# Patient Record
Sex: Female | Born: 1975 | Race: White | Hispanic: No | Marital: Married | Smoking: Former smoker
Health system: Southern US, Community
[De-identification: ages and names within clinical notes are randomized; demographics above are authoritative.]

## PROBLEM LIST (undated history)

## (undated) DIAGNOSIS — F419 Anxiety disorder, unspecified: Secondary | ICD-10-CM

## (undated) DIAGNOSIS — F329 Major depressive disorder, single episode, unspecified: Secondary | ICD-10-CM

## (undated) DIAGNOSIS — F32A Depression, unspecified: Secondary | ICD-10-CM

## (undated) HISTORY — DX: Depression, unspecified: F32.A

## (undated) HISTORY — PX: WRIST SURGERY: SHX841

## (undated) HISTORY — PX: PELVIC LAPAROSCOPY: SHX162

## (undated) HISTORY — DX: Anxiety disorder, unspecified: F41.9

## (undated) HISTORY — DX: Major depressive disorder, single episode, unspecified: F32.9

---

## 2001-07-07 ENCOUNTER — Other Ambulatory Visit: Admission: RE | Admit: 2001-07-07 | Discharge: 2001-07-07 | Payer: Self-pay | Admitting: Obstetrics and Gynecology

## 2004-09-11 ENCOUNTER — Encounter: Admission: RE | Admit: 2004-09-11 | Discharge: 2004-09-11 | Payer: Self-pay | Admitting: Orthopedic Surgery

## 2004-12-24 ENCOUNTER — Emergency Department (HOSPITAL_COMMUNITY): Admission: EM | Admit: 2004-12-24 | Discharge: 2004-12-24 | Payer: Self-pay | Admitting: *Deleted

## 2006-06-06 ENCOUNTER — Ambulatory Visit (HOSPITAL_COMMUNITY): Payer: Self-pay | Admitting: Psychiatry

## 2006-06-26 ENCOUNTER — Ambulatory Visit (HOSPITAL_COMMUNITY): Payer: Self-pay | Admitting: Psychiatry

## 2006-07-31 ENCOUNTER — Ambulatory Visit (HOSPITAL_COMMUNITY): Payer: Self-pay | Admitting: Psychiatry

## 2006-09-03 ENCOUNTER — Ambulatory Visit (HOSPITAL_COMMUNITY): Payer: Self-pay | Admitting: Psychiatry

## 2006-10-02 ENCOUNTER — Ambulatory Visit (HOSPITAL_COMMUNITY): Payer: Self-pay | Admitting: Psychiatry

## 2006-11-21 ENCOUNTER — Ambulatory Visit (HOSPITAL_COMMUNITY): Payer: Self-pay | Admitting: Psychiatry

## 2006-12-19 ENCOUNTER — Ambulatory Visit (HOSPITAL_COMMUNITY): Payer: Self-pay | Admitting: Psychiatry

## 2007-01-27 ENCOUNTER — Ambulatory Visit (HOSPITAL_COMMUNITY): Payer: Self-pay | Admitting: Psychiatry

## 2007-03-04 ENCOUNTER — Ambulatory Visit (HOSPITAL_COMMUNITY): Payer: Self-pay | Admitting: Psychiatry

## 2007-03-21 ENCOUNTER — Ambulatory Visit (HOSPITAL_COMMUNITY): Payer: Self-pay | Admitting: Psychiatry

## 2007-06-05 ENCOUNTER — Ambulatory Visit (HOSPITAL_COMMUNITY): Payer: Self-pay | Admitting: Psychiatry

## 2007-09-10 ENCOUNTER — Ambulatory Visit (HOSPITAL_COMMUNITY): Payer: Self-pay | Admitting: Psychiatry

## 2007-10-21 ENCOUNTER — Ambulatory Visit: Admission: RE | Admit: 2007-10-21 | Discharge: 2007-10-21 | Payer: Self-pay | Admitting: Gynecology

## 2008-01-20 ENCOUNTER — Ambulatory Visit (HOSPITAL_COMMUNITY): Payer: Self-pay | Admitting: Psychiatry

## 2008-07-14 ENCOUNTER — Ambulatory Visit (HOSPITAL_COMMUNITY): Payer: Self-pay | Admitting: Psychiatry

## 2009-02-20 ENCOUNTER — Inpatient Hospital Stay (HOSPITAL_COMMUNITY): Admission: AD | Admit: 2009-02-20 | Discharge: 2009-02-20 | Payer: Self-pay | Admitting: Obstetrics & Gynecology

## 2009-02-20 ENCOUNTER — Ambulatory Visit: Payer: Self-pay | Admitting: Advanced Practice Midwife

## 2009-03-30 ENCOUNTER — Encounter: Admission: RE | Admit: 2009-03-30 | Discharge: 2009-05-11 | Payer: Self-pay | Admitting: Obstetrics & Gynecology

## 2009-06-05 ENCOUNTER — Inpatient Hospital Stay (HOSPITAL_COMMUNITY): Admission: AD | Admit: 2009-06-05 | Discharge: 2009-06-08 | Payer: Self-pay | Admitting: Obstetrics and Gynecology

## 2009-06-09 ENCOUNTER — Encounter: Admission: RE | Admit: 2009-06-09 | Discharge: 2009-07-09 | Payer: Self-pay | Admitting: Obstetrics and Gynecology

## 2009-07-10 ENCOUNTER — Encounter: Admission: RE | Admit: 2009-07-10 | Discharge: 2009-08-09 | Payer: Self-pay | Admitting: Obstetrics & Gynecology

## 2009-09-08 ENCOUNTER — Encounter: Admission: RE | Admit: 2009-09-08 | Discharge: 2009-10-08 | Payer: Self-pay | Admitting: Obstetrics & Gynecology

## 2009-10-09 ENCOUNTER — Encounter: Admission: RE | Admit: 2009-10-09 | Discharge: 2009-11-08 | Payer: Self-pay | Admitting: Obstetrics & Gynecology

## 2009-11-09 ENCOUNTER — Encounter: Admission: RE | Admit: 2009-11-09 | Discharge: 2009-12-09 | Payer: Self-pay | Admitting: Obstetrics & Gynecology

## 2009-12-09 ENCOUNTER — Ambulatory Visit (HOSPITAL_COMMUNITY): Payer: Self-pay | Admitting: Psychiatry

## 2009-12-10 ENCOUNTER — Encounter: Admission: RE | Admit: 2009-12-10 | Discharge: 2010-01-09 | Payer: Self-pay | Admitting: Obstetrics & Gynecology

## 2010-01-10 ENCOUNTER — Encounter: Admission: RE | Admit: 2010-01-10 | Discharge: 2010-02-07 | Payer: Self-pay | Admitting: Obstetrics & Gynecology

## 2010-02-10 ENCOUNTER — Encounter: Admission: RE | Admit: 2010-02-10 | Discharge: 2010-03-12 | Payer: Self-pay | Admitting: Obstetrics & Gynecology

## 2010-03-13 ENCOUNTER — Encounter
Admission: RE | Admit: 2010-03-13 | Discharge: 2010-04-12 | Payer: Self-pay | Source: Home / Self Care | Admitting: Obstetrics & Gynecology

## 2010-04-13 ENCOUNTER — Encounter
Admission: RE | Admit: 2010-04-13 | Discharge: 2010-05-13 | Payer: Self-pay | Source: Home / Self Care | Attending: Obstetrics & Gynecology | Admitting: Obstetrics & Gynecology

## 2010-05-14 ENCOUNTER — Encounter
Admission: RE | Admit: 2010-05-14 | Discharge: 2010-06-13 | Payer: Self-pay | Source: Home / Self Care | Attending: Obstetrics & Gynecology | Admitting: Obstetrics & Gynecology

## 2010-06-14 ENCOUNTER — Encounter (HOSPITAL_COMMUNITY)
Admission: RE | Admit: 2010-06-14 | Discharge: 2010-06-14 | Disposition: A | Discharge status: Home / Self Care | Payer: 59 | Source: Ambulatory Visit | Attending: Obstetrics & Gynecology | Admitting: Obstetrics & Gynecology

## 2010-06-14 DIAGNOSIS — O923 Agalactia: Secondary | ICD-10-CM | POA: Insufficient documentation

## 2010-06-23 ENCOUNTER — Encounter (HOSPITAL_COMMUNITY): Payer: Self-pay | Admitting: Physician Assistant

## 2010-07-07 ENCOUNTER — Encounter (HOSPITAL_COMMUNITY): Payer: 59 | Admitting: Physician Assistant

## 2010-07-07 DIAGNOSIS — F429 Obsessive-compulsive disorder, unspecified: Secondary | ICD-10-CM

## 2010-07-30 LAB — CBC
HCT: 41.4 % (ref 36.0–46.0)
Hemoglobin: 14.2 g/dL (ref 12.0–15.0)
MCHC: 34.2 g/dL (ref 30.0–36.0)
MCV: 92.5 fL (ref 78.0–100.0)
MCV: 93.8 fL (ref 78.0–100.0)
Platelets: 181 10*3/uL (ref 150–400)
Platelets: 222 10*3/uL (ref 150–400)
RBC: 3.56 MIL/uL — ABNORMAL LOW (ref 3.87–5.11)
RBC: 4.48 MIL/uL (ref 3.87–5.11)
RDW: 13.7 % (ref 11.5–15.5)
RDW: 13.9 % (ref 11.5–15.5)
WBC: 14.5 10*3/uL — ABNORMAL HIGH (ref 4.0–10.5)

## 2010-08-29 ENCOUNTER — Encounter (HOSPITAL_COMMUNITY): Payer: 59 | Admitting: Physician Assistant

## 2010-09-26 NOTE — Consult Note (Signed)
NAME:  Miranda Black, Miranda Black NO.:  [id number]   MEDICAL RECORD NO.:  [id number]          PATIENT TYPE:  OUT   LOCATION:  GYN                          FACILITY:  [facility]   PHYSICIAN:  De Blanch, M.D.DATE OF BIRTH:  [birth year]   DATE OF CONSULTATION:  10/21/2007  DATE OF DISCHARGE:                                 CONSULTATION   CHIEF COMPLAINT:  Pelvic mass.   HISTORY OF PRESENT ILLNESS:  A 35 year old white female seen in  consultation at the request of Dr. Marina Gravel regarding management of  a newly diagnosed adnexal mass.  The mass was detected on routine annual  pelvic examination.  The patient denies any new pelvic pain, although  she does note that she has dysmenorrhea, despite the fact being on  chronic birth control pills.  The mass is evaluated by ultrasound and  measures 7.1 x 7.0 x 6.6 cm, and there is a second mass in the right  pelvis measuring 5.8 x 4.6 x 4.7 cm.  This is thought to be possibly a  dermoid or an endometrioma.  There is no increased blood flow.  The left  ovary appears normal with a follicle measuring 2.7 cm.  The patient had  tumor markers obtained, and the CA-125 was 43.5 U/ml, CEA 5, and LDH  129.  Beta HCG is less than 1.   PAST OBSTETRICAL HISTORY:  Gravida 0.   GYNECOLOGIC HISTORY:  Patient uses oral contraceptives.  Has no past  history of abnormal Pap smears.   PAST MEDICAL HISTORY:  Obsessive-compulsive disease.   DRUG ALLERGIES:  None.   CURRENT MEDICATIONS:  1. Zoloft 50 mg a day.  2. Adderall 20 mg q.a.m.   SOCIAL HISTORY:  Patient is married.  She smokes 1-4 cigarettes a day.   FAMILY HISTORY:  Negative for gynecologic, breast, or colon cancer.  She  has a maternal grandmother with lung cancer.   REVIEW OF SYSTEMS:  A 10-point comprehensive review of systems is  negative, except as noted above.   PHYSICAL EXAMINATION:  Weight 127 pounds.  Blood pressure 110/70, pulse  70, respiratory rate 20.  GENERAL:   Patient is a healthy young woman in no acute distress.  She  comes accompanied by her mother.  HEENT:  Negative.  NECK:  Supple without thyromegaly.  There is no supraclavicular or  inguinal adenopathy.  ABDOMEN:  Soft and nontender.  No masses, organomegaly, ascites, or  hernias are noted.  PELVIC:  EG/BUS, vagina, bladder, and urethra are normal.  The cervix  and uterus appear normal.  On bimanual examination, there is a 7 cm  smooth adnexal mass, which is slightly tender in the right adnexal  region.  Rectovaginal exam confirms.  EXTREMITIES:  Lower extremities are without edema and varicosities.   IMPRESSION:  Complex pelvic mass with an elevated CEA and CA-125.  This  may well be a benign endometrioma or mucinous cyst adenoma, although  malignancy must still be considered.  I would recommend the patient  undergo exploratory laparotomy, ovarian cystectomy with attempts at  conserving normal ovarian tissue until we  have a firm diagnosis.  The  patient understands that she may lose her ovary on that side and further  understands that his may be ovarian cancer, although it would be unusual  in a young woman in this age group.   Patient wishes to go ahead with surgery in conjunction with her upcoming  vacation.  The only opportunity we have to perform surgery within a few  days of her vacation would be to perform the surgery on November 18, 2007 at  [facility].  She would like to go ahead, and we will therefore schedule the  surgery to be performed at [facility].  We will plan on performing the surgery  through a Pfannenstiel incision, although she understands that a midline  incision may be required in order to gain adequate exposure or to  conduct appropriate surgical staging.  The risks of surgery, including  hemorrhage, infection, injury to adjacent viscera, thromboembolic  complications, and anesthetic risks were discussed.  All questions are  answered that the patient and her mother  have.      De Blanch, M.D.  Electronically Signed     DC/MEDQ  D:  10/21/2007  T:  10/21/2007  Job:  [id number]   cc:   Gerri Spore B. Earlene Plater, M.D.  Fax: [phone]   Telford Nab, R.N.  [address]  [place], [place] [address]

## 2017-08-08 ENCOUNTER — Other Ambulatory Visit: Payer: Self-pay

## 2017-12-06 ENCOUNTER — Encounter: Payer: Self-pay | Admitting: Obstetrics & Gynecology

## 2018-03-18 ENCOUNTER — Ambulatory Visit (INDEPENDENT_AMBULATORY_CARE_PROVIDER_SITE_OTHER): Payer: Managed Care, Other (non HMO) | Admitting: Obstetrics & Gynecology

## 2018-03-18 ENCOUNTER — Encounter: Payer: Self-pay | Admitting: Obstetrics & Gynecology

## 2018-03-18 VITALS — BP 116/72 | Ht 63.0 in | Wt 148.0 lb

## 2018-03-18 DIAGNOSIS — Z01419 Encounter for gynecological examination (general) (routine) without abnormal findings: Secondary | ICD-10-CM

## 2018-03-18 DIAGNOSIS — Z1151 Encounter for screening for human papillomavirus (HPV): Secondary | ICD-10-CM | POA: Diagnosis not present

## 2018-03-18 DIAGNOSIS — Z30011 Encounter for initial prescription of contraceptive pills: Secondary | ICD-10-CM

## 2018-03-18 MED ORDER — NORETHIN ACE-ETH ESTRAD-FE 1-20 MG-MCG PO TABS: 1.0000 | ORAL_TABLET | Freq: Every day | ORAL | 4 refills | Status: DC

## 2018-03-18 NOTE — Progress Notes (Signed)
Miranda Black 24-Jun-1975 [id number]   History:    42 y.o. G1P1L1 Married.  Daughter is 40 yo in 3rd grade.  RP:  Established patient presenting for annual gyn exam   HPI: Normal menstrual periods every month.  No breakthrough bleeding.  Increased PMS over the last year.  Currently using condoms, but would like to start on birth control pills.  No pelvic pain.  No pain with intercourse.  Urine and bowel movements normal.  Breasts normal.  Will schedule first screening mammogram now.  Body mass index 26.22.  Fit and eating well.  Past medical history,surgical history, family history and social history were all reviewed and documented in the EPIC chart.  Gynecologic History Patient's last menstrual period was 03/09/2018. Contraception: condoms Last Pap: 2017. Results were: normal Last mammogram: [facility] Bone Density: Never Colonoscopy: Never  Obstetric History OB History  Gravida Para Term Preterm AB Living  1 1       1   SAB TAB Ectopic Multiple Live Births               # Outcome Date GA Lbr Len/2nd Weight Sex Delivery Anes PTL Lv  1 Para              ROS: A ROS was performed and pertinent positives and negatives are included in the history.  GENERAL: No fevers or chills. HEENT: No change in vision, no earache, sore throat or sinus congestion. NECK: No pain or stiffness. CARDIOVASCULAR: No chest pain or pressure. No palpitations. PULMONARY: No shortness of breath, cough or wheeze. GASTROINTESTINAL: No abdominal pain, nausea, vomiting or diarrhea, melena or bright red blood per rectum. GENITOURINARY: No urinary frequency, urgency, hesitancy or dysuria. MUSCULOSKELETAL: No joint or muscle pain, no back pain, no recent trauma. DERMATOLOGIC: No rash, no itching, no lesions. ENDOCRINE: No polyuria, polydipsia, no heat or cold intolerance. No recent change in weight. HEMATOLOGICAL: No anemia or easy bruising or bleeding. NEUROLOGIC: No headache, seizures,  numbness, tingling or weakness. PSYCHIATRIC: No depression, no loss of interest in normal activity or change in sleep pattern.     Exam:   BP 116/72   Ht 5\' 3"  (1.6 m)   Wt 148 lb (67.1 kg)   LMP 03/09/2018   BMI 26.22 kg/m   Body mass index is 26.22 kg/m.  General appearance : Well developed well nourished female. No acute distress HEENT: Eyes: no retinal hemorrhage or exudates,  Neck supple, trachea midline, no carotid bruits, no thyroidmegaly Lungs: Clear to auscultation, no rhonchi or wheezes, or rib retractions  Heart: Regular rate and rhythm, no murmurs or gallops Breast:Examined in sitting and supine position were symmetrical in appearance, no palpable masses or tenderness,  no skin retraction, no nipple inversion, no nipple discharge, no skin discoloration, no axillary or supraclavicular lymphadenopathy Abdomen: no palpable masses or tenderness, no rebound or guarding Extremities: no edema or skin discoloration or tenderness  Pelvic: Vulva: Normal             Vagina: No gross lesions or discharge  Cervix: No gross lesions or discharge.  Pap/HPV HR done  Uterus  AV, normal size, shape and consistency, non-tender and mobile  Adnexa  Without masses or tenderness  Anus: Normal   Assessment/Plan:  42 y.o. female for annual exam   1. Encounter for routine gynecological examination with Papanicolaou smear of cervix Normal gynecologic exam.  Pap with high-risk HPV done today.  Breast exam normal.  Schedule  first screening mammogram at the breast center now.  Body mass index 26.22.  Continue to exercise regularly with aerobic activities 5 times a week and weightlifting every 2 days.  Healthy nutrition with plenty of vegetables.  2. Encounter for initial prescription of contraceptive pills Contraceptive options discussed with patient.  Prefers birth control pills.  Decision to start on Junel FE 1/20.  Usage of birth control pill as well as continuous use discussed with patient.   Risks and benefits reviewed.  Prescription sent to pharmacy.  Order Junel FE 1/20 prescription of 3 packs, refills x4  Genia Del MD, 2:39 PM 03/18/2018

## 2018-03-19 ENCOUNTER — Encounter: Payer: Self-pay | Admitting: Obstetrics & Gynecology

## 2018-03-19 NOTE — Patient Instructions (Signed)
1. Encounter for routine gynecological examination with Papanicolaou smear of cervix Normal gynecologic exam.  Pap with high-risk HPV done today.  Breast exam normal.  Schedule first screening mammogram at the breast center now.  Body mass index 26.22.  Continue to exercise regularly with aerobic activities 5 times a week and weightlifting every 2 days.  Healthy nutrition with plenty of vegetables.  2. Encounter for initial prescription of contraceptive pills Contraceptive options discussed with patient.  Prefers birth control pills.  Decision to start on Junel FE 1/20.  Usage of birth control pill as well as continuous use discussed with patient.  Risks and benefits reviewed.  Prescription sent to pharmacy.  Order Junel FE 1/20 prescription of 3 packs, refills x4  Miranda Black, it was a pleasure seeing you today!  I will inform you of your results as soon as they are available.

## 2018-03-20 LAB — PAP, TP IMAGING W/ HPV RNA, RFLX HPV TYPE 16,18/45: HPV DNA High Risk: NOT DETECTED

## 2018-07-16 DIAGNOSIS — F988 Other specified behavioral and emotional disorders with onset usually occurring in childhood and adolescence: Secondary | ICD-10-CM | POA: Diagnosis not present

## 2018-07-16 DIAGNOSIS — F429 Obsessive-compulsive disorder, unspecified: Secondary | ICD-10-CM | POA: Diagnosis not present

## 2018-07-16 DIAGNOSIS — J069 Acute upper respiratory infection, unspecified: Secondary | ICD-10-CM | POA: Diagnosis not present

## 2018-09-08 DIAGNOSIS — F429 Obsessive-compulsive disorder, unspecified: Secondary | ICD-10-CM | POA: Diagnosis not present

## 2018-09-08 DIAGNOSIS — F988 Other specified behavioral and emotional disorders with onset usually occurring in childhood and adolescence: Secondary | ICD-10-CM | POA: Diagnosis not present

## 2018-10-28 DIAGNOSIS — Z20828 Contact with and (suspected) exposure to other viral communicable diseases: Secondary | ICD-10-CM | POA: Diagnosis not present

## 2019-02-10 DIAGNOSIS — Z23 Encounter for immunization: Secondary | ICD-10-CM | POA: Diagnosis not present

## 2019-03-05 DIAGNOSIS — F988 Other specified behavioral and emotional disorders with onset usually occurring in childhood and adolescence: Secondary | ICD-10-CM | POA: Diagnosis not present

## 2019-03-05 DIAGNOSIS — F429 Obsessive-compulsive disorder, unspecified: Secondary | ICD-10-CM | POA: Diagnosis not present

## 2019-03-19 ENCOUNTER — Other Ambulatory Visit: Payer: Self-pay | Admitting: Obstetrics & Gynecology

## 2019-03-19 ENCOUNTER — Other Ambulatory Visit: Payer: Self-pay | Admitting: Family Medicine

## 2019-03-19 DIAGNOSIS — Z1231 Encounter for screening mammogram for malignant neoplasm of breast: Secondary | ICD-10-CM

## 2019-03-24 ENCOUNTER — Ambulatory Visit
Admission: RE | Admit: 2019-03-24 | Discharge: 2019-03-24 | Disposition: A | Discharge status: Home / Self Care | Payer: BC Managed Care – PPO | Source: Ambulatory Visit | Attending: Family Medicine | Admitting: Family Medicine

## 2019-03-24 ENCOUNTER — Other Ambulatory Visit: Payer: Self-pay

## 2019-03-24 DIAGNOSIS — Z1231 Encounter for screening mammogram for malignant neoplasm of breast: Secondary | ICD-10-CM | POA: Diagnosis not present

## 2019-06-22 ENCOUNTER — Ambulatory Visit: Payer: Managed Care, Other (non HMO) | Attending: Internal Medicine

## 2019-06-22 DIAGNOSIS — Z23 Encounter for immunization: Secondary | ICD-10-CM

## 2019-06-22 NOTE — Progress Notes (Signed)
   Covid-19 Vaccination Clinic  Name:  Miranda Black    MRN: [id number] DOB: 29-May-1975  06/22/2019  Miranda Black was observed post Covid-19 immunization for 15 minutes without incidence. She was provided with Vaccine Information Sheet and instruction to access the V-Safe system.   Miranda Black was instructed to call 911 with any severe reactions post vaccine: Marland Kitchen Difficulty breathing  . Swelling of your face and throat  . A fast heartbeat  . A bad rash all over your body  . Dizziness and weakness    Immunizations Administered    Name Date Dose VIS Date Route   Pfizer COVID-19 Vaccine 06/22/2019  5:52 PM 0.3 mL 04/24/2019 Intramuscular   Manufacturer: [employer], [employer]   Lot: [id number]   NDC: [address]-8

## 2019-07-17 ENCOUNTER — Ambulatory Visit: Payer: Self-pay | Attending: Internal Medicine

## 2019-07-17 DIAGNOSIS — Z23 Encounter for immunization: Secondary | ICD-10-CM | POA: Insufficient documentation

## 2019-07-17 NOTE — Progress Notes (Signed)
   Covid-19 Vaccination Clinic  Name:  Miranda Black    MRN: [id number] DOB: 1975-12-08  07/17/2019  Miranda Black was observed post Covid-19 immunization for 15 minutes without incident. She was provided with Vaccine Information Sheet and instruction to access the V-Safe system.   Miranda Black was instructed to call 911 with any severe reactions post vaccine: Marland Kitchen Difficulty breathing  . Swelling of face and throat  . A fast heartbeat  . A bad rash all over body  . Dizziness and weakness   Immunizations Administered    Name Date Dose VIS Date Route   Pfizer COVID-19 Vaccine 07/17/2019  5:05 PM 0.3 mL 04/24/2019 Intramuscular   Manufacturer: [employer], [employer]   Lot: [id number]   NDC: [address]-0

## 2019-07-30 ENCOUNTER — Encounter: Payer: Self-pay | Admitting: Obstetrics & Gynecology

## 2019-07-30 ENCOUNTER — Other Ambulatory Visit: Payer: Self-pay

## 2019-07-30 ENCOUNTER — Ambulatory Visit: Payer: BC Managed Care – PPO | Admitting: Obstetrics & Gynecology

## 2019-07-30 VITALS — BP 118/70

## 2019-07-30 DIAGNOSIS — Z30011 Encounter for initial prescription of contraceptive pills: Secondary | ICD-10-CM | POA: Diagnosis not present

## 2019-07-30 DIAGNOSIS — R350 Frequency of micturition: Secondary | ICD-10-CM | POA: Diagnosis not present

## 2019-07-30 DIAGNOSIS — R102 Pelvic and perineal pain: Secondary | ICD-10-CM | POA: Diagnosis not present

## 2019-07-30 MED ORDER — SULFAMETHOXAZOLE-TRIMETHOPRIM 800-160 MG PO TABS: 1.0000 | ORAL_TABLET | Freq: Two times a day (BID) | ORAL | 0 refills | Status: AC

## 2019-07-30 MED ORDER — NORETHIN ACE-ETH ESTRAD-FE 1-20 MG-MCG PO TABS: 1.0000 | ORAL_TABLET | Freq: Every day | ORAL | 4 refills | Status: DC

## 2019-07-30 NOTE — Progress Notes (Signed)
    [facility] 02/08/1976 [id number]        44 y.o.  G1P1L1 Married.  Daughter 9+ yo   RP: Urinary frequency with pelvic pain x 1 week  HPI: Normal menses every month.  Not using contraception.  Normal LMP 07/09/2019.  C/O urinary frequency with pelivc pain Rt>Lt x 1 week.  No blood in urine.  No fever.  BMs normal.   OB History  Gravida Para Term Preterm AB Living  1 1       1   SAB TAB Ectopic Multiple Live Births               # Outcome Date GA Lbr Len/2nd Weight Sex Delivery Anes PTL Lv  1 Para             Past medical history,surgical history, problem list, medications, allergies, family history and social history were all reviewed and documented in the EPIC chart.   Directed ROS with pertinent positives and negatives documented in the history of present illness/assessment and plan.  Exam:  Vitals:   07/30/19 1520  BP: 118/70   General appearance:  Normal  CVAT Negative Bilaterally  Abdomen: Normal  Gynecologic exam: Vulvar.  Bimanual exam:  Uterus AV, normal volume, mobile, NT.  No adnexal mass/NT bilaterally.  Normal vaginal secretions.  U/A: Yellow cloudy, nitrites negative, proteins negative, white blood cells 10-20, red blood cells 0-2, moderate bacteria.  Pending urine culture.   Assessment/Plan:  44 y.o. G1P1   1. Frequency of urination Probable acute cystitis per symptoms and abnormal urine analysis.  Findings reviewed with patient.  No allergies to antibiotics.  Decision to treat with Bactrim DS 1 tablet twice a day for 3 days.  Usage reviewed and prescription sent to pharmacy.  Pending urine culture. - Urinalysis,Complete w/RFL Culture  2. Acute pelvic pain, female Pelvic exam normal today but decision to investigate further with a pelvic ultrasound at follow-up. - 46 Transvaginal Non-OB; Future  3. Encounter for initial prescription of contraceptive pills Will start on a low-dose birth control pill for contraception.  No contraindication to  birth control pills.  Usage, risks and benefits reviewed.  The generic of Loestrin Fe 1/20 prescribed.  Other orders - sulfamethoxazole-trimethoprim (BACTRIM DS) 800-160 MG tablet; Take 1 tablet by mouth 2 (two) times daily for 3 days. - norethindrone-ethinyl estradiol (LOESTRIN FE) 1-20 MG-MCG tablet; Take 1 tablet by mouth daily.   2/20 MD, 3:43 PM 07/30/2019

## 2019-08-01 LAB — URINALYSIS, COMPLETE W/RFL CULTURE
Bilirubin Urine: NEGATIVE
Glucose, UA: NEGATIVE
Hyaline Cast: NONE SEEN /LPF
Ketones, ur: NEGATIVE
Nitrites, Initial: NEGATIVE
Protein, ur: NEGATIVE
Specific Gravity, Urine: 1.025 (ref 1.001–1.03)
pH: 7 (ref 5.0–8.0)

## 2019-08-01 LAB — CULTURE INDICATED

## 2019-08-01 LAB — URINE CULTURE
MICRO NUMBER:: 10266236
Result:: NO GROWTH
SPECIMEN QUALITY:: ADEQUATE

## 2019-08-06 ENCOUNTER — Encounter: Payer: Self-pay | Admitting: Obstetrics & Gynecology

## 2019-08-06 NOTE — Patient Instructions (Signed)
1. Frequency of urination Probable acute cystitis per symptoms and abnormal urine analysis.  Findings reviewed with patient.  No allergies to antibiotics.  Decision to treat with Bactrim DS 1 tablet twice a day for 3 days.  Usage reviewed and prescription sent to pharmacy.  Pending urine culture. - Urinalysis,Complete w/RFL Culture  2. Acute pelvic pain, female Pelvic exam normal today but decision to investigate further with a pelvic ultrasound at follow-up. - US Transvaginal Non-OB; Future  3. Encounter for initial prescription of contraceptive pills Will start on a low-dose birth control pill for contraception.  No contraindication to birth control pills.  Usage, risks and benefits reviewed.  The generic of Loestrin Fe 1/20 prescribed.  Other orders - sulfamethoxazole-trimethoprim (BACTRIM DS) 800-160 MG tablet; Take 1 tablet by mouth 2 (two) times daily for 3 days. - norethindrone-ethinyl estradiol (LOESTRIN FE) 1-20 MG-MCG tablet; Take 1 tablet by mouth daily.   Miranda Black, it was a pleasure seeing you today!  I will inform you of your results as soon as they are available.

## 2019-08-12 ENCOUNTER — Other Ambulatory Visit: Payer: Self-pay

## 2019-08-13 ENCOUNTER — Encounter: Payer: Self-pay | Admitting: Obstetrics & Gynecology

## 2019-08-13 ENCOUNTER — Ambulatory Visit (INDEPENDENT_AMBULATORY_CARE_PROVIDER_SITE_OTHER): Payer: BC Managed Care – PPO | Admitting: Obstetrics & Gynecology

## 2019-08-13 ENCOUNTER — Ambulatory Visit (INDEPENDENT_AMBULATORY_CARE_PROVIDER_SITE_OTHER): Payer: BC Managed Care – PPO

## 2019-08-13 VITALS — BP 122/76

## 2019-08-13 DIAGNOSIS — R102 Pelvic and perineal pain: Secondary | ICD-10-CM

## 2019-08-13 DIAGNOSIS — N800 Endometriosis of uterus: Secondary | ICD-10-CM | POA: Diagnosis not present

## 2019-08-13 DIAGNOSIS — N8003 Adenomyosis of the uterus: Secondary | ICD-10-CM

## 2019-08-13 NOTE — Progress Notes (Signed)
    Miranda Black [facility] [birth year] [id number]        44 y.o.  G1P1L1  RP: Pelvic Pain for Pelvic US  HPI: Decreased pelvic pain, feeling better.  Finished the ABTx, but Urine Culture was negative.  Will start BCPs this Sunday.   OB History  Gravida Para Term Preterm AB Living  1 1       1   SAB TAB Ectopic Multiple Live Births               # Outcome Date GA Lbr Len/2nd Weight Sex Delivery Anes PTL Lv  1 Para             Past medical history,surgical history, problem list, medications, allergies, family history and social history were all reviewed and documented in the EPIC chart.   Directed ROS with pertinent positives and negatives documented in the history of present illness/assessment and plan.  Exam:  Vitals:   08/13/19 0932  BP: 122/76   General appearance:  Normal  Pelvic 10/13/19 today: T/V images.  Anteverted uterus normal in size and shape with no myometrial mass.  Several small cystic areas in the myometrium are suggestive of adenomyosis.  The uterus is measured at 9.31 x 5.75 x 3.89 cm.  The endometrial lining is thin and symmetrical measured at 3.28 mm.  No mass or thickening seen in the endometrium.  Both ovaries are mobile and nontender with normal perfusion.  The right ovary shows a normal follicular pattern.  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  No free fluid in the posterior cul-de-sac.  Nontender vaginal exam.  No mass or free fluid seen in the area of the appendix.   Assessment/Plan:  44 y.o. G1P1   1. Acute pelvic pain, female Urine Culture Negative.  No pelvic pain today.  Pelvic 46 findings reviewed.  The uterus is normal in size with probable adenomyosis.  The endometrial lining is thin and symmetrical at 3.28 millimeter with no mass or thickening.  The right ovary is normal in size and appearance.  The left ovary is normal in size with a small nodule  measured at 1.6 cm compatible with a small dermoid or fibroma.  Patient is reassured about the findings.  Will observe on birth control pills.  2. Adenomyosis Diagnosis discussed with patient, reassured.  Management reviewed.  Starting BCPs.  Continuous BCP use instructed.  [place] MD, 9:51 AM 08/13/2019

## 2019-08-13 NOTE — Patient Instructions (Signed)
1. Acute pelvic pain, female Urine Culture Negative.  No pelvic pain today.  Pelvic US findings reviewed.  The uterus is normal in size with probable adenomyosis.  The endometrial lining is thin and symmetrical at 3.28 millimeter with no mass or thickening.  The right ovary is normal in size and appearance.  The left ovary is normal in size with a small nodule measured at 1.6 cm compatible with a small dermoid or fibroma.  Patient is reassured about the findings.  Will observe on birth control pills.  2. Adenomyosis Diagnosis discussed with patient, reassured.  Management reviewed.  Starting BCPs.  Continuous BCP use instructed.  Miranda Black, it was a pleasure seeing you today!

## 2019-08-31 DIAGNOSIS — F429 Obsessive-compulsive disorder, unspecified: Secondary | ICD-10-CM | POA: Diagnosis not present

## 2019-08-31 DIAGNOSIS — F988 Other specified behavioral and emotional disorders with onset usually occurring in childhood and adolescence: Secondary | ICD-10-CM | POA: Diagnosis not present

## 2019-09-15 ENCOUNTER — Ambulatory Visit (INDEPENDENT_AMBULATORY_CARE_PROVIDER_SITE_OTHER): Payer: BC Managed Care – PPO | Admitting: Obstetrics & Gynecology

## 2019-09-15 ENCOUNTER — Other Ambulatory Visit: Payer: Self-pay

## 2019-09-15 ENCOUNTER — Encounter: Payer: Self-pay | Admitting: Obstetrics & Gynecology

## 2019-09-15 VITALS — BP 120/70 | Ht 62.5 in | Wt 148.8 lb

## 2019-09-15 DIAGNOSIS — R87610 Atypical squamous cells of undetermined significance on cytologic smear of cervix (ASC-US): Secondary | ICD-10-CM | POA: Diagnosis not present

## 2019-09-15 DIAGNOSIS — R350 Frequency of micturition: Secondary | ICD-10-CM | POA: Diagnosis not present

## 2019-09-15 DIAGNOSIS — Z01419 Encounter for gynecological examination (general) (routine) without abnormal findings: Secondary | ICD-10-CM | POA: Diagnosis not present

## 2019-09-15 DIAGNOSIS — Z1151 Encounter for screening for human papillomavirus (HPV): Secondary | ICD-10-CM

## 2019-09-15 DIAGNOSIS — Z3041 Encounter for surveillance of contraceptive pills: Secondary | ICD-10-CM | POA: Diagnosis not present

## 2019-09-15 MED ORDER — CIPROFLOXACIN HCL 500 MG PO TABS: 500.0000 mg | ORAL_TABLET | Freq: Two times a day (BID) | ORAL | 0 refills | Status: AC

## 2019-09-15 NOTE — Patient Instructions (Signed)
1. Encounter for routine gynecological examination with Papanicolaou smear of cervix Normal gynecologic exam.  Pap/HPV HR done.  Breast exam normal.  Screening mammo 03/2019 Negative.  Fasting health labs with Fam MD.  2. ASCUS of cervix with negative high risk HPV Pap 03/2018 ASCUS, HPV HR negative.  3. Encounter for surveillance of contraceptive pills Well on the generic of LoEstrin Fe 1/20.  Started continuous use early 08/2019 for Dysmenorrhea with probable Adenomyosis on Pelvic US 08/13/2019.  4. Frequency of urination Probable acute cystitis per Sxs and U/A.  Will treat with Ciprofloxacin.  No CI, no allergy.  Usage reviewed and prescription sent to pharmacy. - Urinalysis,Complete w/RFL Culture  Other orders - ciprofloxacin (CIPRO) 500 MG tablet; Take 1 tablet (500 mg total) by mouth 2 (two) times daily for 7 days.  Miranda Black, it was a pleasure seeing you today!  I will inform you of your results as soon as they are available.

## 2019-09-15 NOTE — Progress Notes (Signed)
Masayo Fera [facility] 1975-08-15 [id number]   History:    44 y.o. G1P1L1 Married.  Daughter is 36 yo in 4rd grade.  RP:  Established patient presenting for annual gyn exam   HPI:  Started on continuous BCPs with the generic of LoEstrin Fe 1/20 early 08/2019.  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  No pain with intercourse.  UTI Sxs with urinary frequency and burning with urination.  Bowel movements normal.  Breasts normal.  Body mass index 26.78.  Fit and eating well.   Past medical history,surgical history, family history and social history were all reviewed and documented in the EPIC chart.  Gynecologic History Patient's last menstrual period was 09/05/2019.  Obstetric History OB History  Gravida Para Term Preterm AB Living  1 1       1   SAB TAB Ectopic Multiple Live Births               # Outcome Date GA Lbr Len/2nd Weight Sex Delivery Anes PTL Lv  1 Para              ROS: A ROS was performed and pertinent positives and negatives are included in the history.  GENERAL: No fevers or chills. HEENT: No change in vision, no earache, sore throat or sinus congestion. NECK: No pain or stiffness. CARDIOVASCULAR: No chest pain or pressure. No palpitations. PULMONARY: No shortness of breath, cough or wheeze. GASTROINTESTINAL: No abdominal pain, nausea, vomiting or diarrhea, melena or bright red blood per rectum. GENITOURINARY: No urinary frequency, urgency, hesitancy or dysuria. MUSCULOSKELETAL: No joint or muscle pain, no back pain, no recent trauma. DERMATOLOGIC: No rash, no itching, no lesions. ENDOCRINE: No polyuria, polydipsia, no heat or cold intolerance. No recent change in weight. HEMATOLOGICAL: No anemia or easy bruising or bleeding. NEUROLOGIC: No headache, seizures, numbness, tingling or weakness. PSYCHIATRIC: No depression, no loss of interest in normal activity or change in sleep pattern.     Exam:   BP 120/70   Ht 5' 2.5" (1.588 m)   Wt 148 lb 12.8 oz (67.5 kg)    LMP 09/05/2019 Comment: pill  BMI 26.78 kg/m   Body mass index is 26.78 kg/m.  General appearance : Well developed well nourished female. No acute distress HEENT: Eyes: no retinal hemorrhage or exudates,  Neck supple, trachea midline, no carotid bruits, no thyroidmegaly Lungs: Clear to auscultation, no rhonchi or wheezes, or rib retractions  Heart: Regular rate and rhythm, no murmurs or gallops Breast:Examined in sitting and supine position were symmetrical in appearance, no palpable masses or tenderness,  no skin retraction, no nipple inversion, no nipple discharge, no skin discoloration, no axillary or supraclavicular lymphadenopathy Abdomen: no palpable masses or tenderness, no rebound or guarding Extremities: no edema or skin discoloration or tenderness  Pelvic: Vulva: Normal             Vagina: No gross lesions or discharge  Cervix: No gross lesions or discharge.  Pap/HPV HR done.  Uterus  AV, normal size, shape and consistency, non-tender and mobile  Adnexa  Without masses or tenderness  Anus: Normal  U/A: Yellow cloudy, protein negative, nitrite negative, white blood cells 10-20, red blood cells 10-20, bacteria moderate.  Urine culture pending.   Assessment/Plan:  44 y.o. female for annual exam   1. Encounter for routine gynecological examination with Papanicolaou smear of cervix Normal gynecologic exam.  Pap/HPV HR done.  Breast exam normal.  Screening mammo 03/2019 Negative.  Fasting health labs with Fam MD.  2. ASCUS of cervix with negative high risk HPV Pap 03/2018 ASCUS, HPV HR negative.  3. Encounter for surveillance of contraceptive pills Well on the generic of LoEstrin Fe 1/20.  Started continuous use early 08/2019 for Dysmenorrhea with probable Adenomyosis on Pelvic US 08/13/2019.  4. Frequency of urination Probable acute cystitis per Sxs and U/A.  Will treat with Ciprofloxacin.  No CI, no allergy.  Usage reviewed and prescription sent to pharmacy. -  Urinalysis,Complete w/RFL Culture  Other orders - ciprofloxacin (CIPRO) 500 MG tablet; Take 1 tablet (500 mg total) by mouth 2 (two) times daily for 7 days.  Princess Bruins MD, 3:40 PM 09/15/2019

## 2019-09-16 DIAGNOSIS — Z01419 Encounter for gynecological examination (general) (routine) without abnormal findings: Secondary | ICD-10-CM | POA: Diagnosis not present

## 2019-09-16 DIAGNOSIS — Z1151 Encounter for screening for human papillomavirus (HPV): Secondary | ICD-10-CM | POA: Diagnosis not present

## 2019-09-16 NOTE — Addendum Note (Signed)
Addended by: Berna Spare A on: 09/16/2019 08:04 AM   Modules accepted: Orders

## 2019-09-17 LAB — URINALYSIS, COMPLETE W/RFL CULTURE
Bilirubin Urine: NEGATIVE
Glucose, UA: NEGATIVE
Hyaline Cast: NONE SEEN /LPF
Nitrites, Initial: NEGATIVE
Protein, ur: NEGATIVE
Specific Gravity, Urine: 1.025 (ref 1.001–1.03)
pH: 6 (ref 5.0–8.0)

## 2019-09-17 LAB — CULTURE INDICATED

## 2019-09-17 LAB — URINE CULTURE
MICRO NUMBER:: 10437046
Result:: NO GROWTH
SPECIMEN QUALITY:: ADEQUATE

## 2019-09-18 ENCOUNTER — Encounter: Payer: Self-pay | Admitting: *Deleted

## 2019-09-19 LAB — PAP, TP IMAGING W/ HPV RNA, RFLX HPV TYPE 16,18/45: HPV DNA High Risk: NOT DETECTED

## 2019-09-24 ENCOUNTER — Other Ambulatory Visit: Payer: Self-pay

## 2019-09-24 ENCOUNTER — Other Ambulatory Visit: Payer: Self-pay | Admitting: Family Medicine

## 2019-09-24 ENCOUNTER — Ambulatory Visit
Admission: RE | Admit: 2019-09-24 | Discharge: 2019-09-24 | Disposition: A | Discharge status: Home / Self Care | Payer: Self-pay | Source: Ambulatory Visit | Attending: Family Medicine | Admitting: Family Medicine

## 2019-09-24 DIAGNOSIS — R300 Dysuria: Secondary | ICD-10-CM | POA: Diagnosis not present

## 2019-09-24 DIAGNOSIS — R319 Hematuria, unspecified: Secondary | ICD-10-CM

## 2019-09-24 DIAGNOSIS — R109 Unspecified abdominal pain: Secondary | ICD-10-CM | POA: Diagnosis not present

## 2019-10-04 DIAGNOSIS — S80211A Abrasion, right knee, initial encounter: Secondary | ICD-10-CM | POA: Diagnosis not present

## 2019-10-04 DIAGNOSIS — S0083XA Contusion of other part of head, initial encounter: Secondary | ICD-10-CM | POA: Diagnosis not present

## 2019-10-04 DIAGNOSIS — W19XXXA Unspecified fall, initial encounter: Secondary | ICD-10-CM | POA: Diagnosis not present

## 2019-10-08 DIAGNOSIS — N201 Calculus of ureter: Secondary | ICD-10-CM | POA: Diagnosis not present

## 2019-10-15 DIAGNOSIS — H539 Unspecified visual disturbance: Secondary | ICD-10-CM | POA: Diagnosis not present

## 2019-10-15 DIAGNOSIS — S93401D Sprain of unspecified ligament of right ankle, subsequent encounter: Secondary | ICD-10-CM | POA: Diagnosis not present

## 2019-10-15 DIAGNOSIS — W19XXXD Unspecified fall, subsequent encounter: Secondary | ICD-10-CM | POA: Diagnosis not present

## 2020-01-27 DIAGNOSIS — F429 Obsessive-compulsive disorder, unspecified: Secondary | ICD-10-CM | POA: Diagnosis not present

## 2020-01-27 DIAGNOSIS — F988 Other specified behavioral and emotional disorders with onset usually occurring in childhood and adolescence: Secondary | ICD-10-CM | POA: Diagnosis not present

## 2020-07-22 DIAGNOSIS — F988 Other specified behavioral and emotional disorders with onset usually occurring in childhood and adolescence: Secondary | ICD-10-CM | POA: Diagnosis not present

## 2020-07-22 DIAGNOSIS — F429 Obsessive-compulsive disorder, unspecified: Secondary | ICD-10-CM | POA: Diagnosis not present

## 2020-08-03 DIAGNOSIS — D229 Melanocytic nevi, unspecified: Secondary | ICD-10-CM | POA: Diagnosis not present

## 2020-08-03 DIAGNOSIS — D485 Neoplasm of uncertain behavior of skin: Secondary | ICD-10-CM | POA: Diagnosis not present

## 2020-08-03 DIAGNOSIS — L814 Other melanin hyperpigmentation: Secondary | ICD-10-CM | POA: Diagnosis not present

## 2020-08-03 DIAGNOSIS — D225 Melanocytic nevi of trunk: Secondary | ICD-10-CM | POA: Diagnosis not present

## 2020-09-27 ENCOUNTER — Ambulatory Visit: Payer: BC Managed Care – PPO | Admitting: Obstetrics & Gynecology

## 2020-10-12 ENCOUNTER — Other Ambulatory Visit: Payer: Self-pay

## 2020-10-12 ENCOUNTER — Other Ambulatory Visit (HOSPITAL_COMMUNITY)
Admission: RE | Admit: 2020-10-12 | Discharge: 2020-10-12 | Disposition: A | Discharge status: Home / Self Care | Payer: BC Managed Care – PPO | Source: Ambulatory Visit | Attending: Obstetrics & Gynecology | Admitting: Obstetrics & Gynecology

## 2020-10-12 ENCOUNTER — Encounter: Payer: Self-pay | Admitting: Obstetrics & Gynecology

## 2020-10-12 ENCOUNTER — Ambulatory Visit (INDEPENDENT_AMBULATORY_CARE_PROVIDER_SITE_OTHER): Payer: BC Managed Care – PPO | Admitting: Obstetrics & Gynecology

## 2020-10-12 VITALS — BP 114/72 | Ht 62.75 in | Wt 154.0 lb

## 2020-10-12 DIAGNOSIS — Z3041 Encounter for surveillance of contraceptive pills: Secondary | ICD-10-CM

## 2020-10-12 DIAGNOSIS — Z01419 Encounter for gynecological examination (general) (routine) without abnormal findings: Secondary | ICD-10-CM

## 2020-10-12 MED ORDER — NORETHIN ACE-ETH ESTRAD-FE 1-20 MG-MCG PO TABS: 1.0000 | ORAL_TABLET | Freq: Every day | ORAL | 4 refills | Status: AC

## 2020-10-12 NOTE — Progress Notes (Signed)
[facility] [place] 01-Oct-1975 [id number]   History:    45 y.o. G1P1L1 Married. Daughter is 4 yo in 5th grade.  KC:[id number] presenting for annual gyn exam   FFM:[id number] on continuous BCPs with the generic of LoEstrin Fe 1/20 early 08/2019.  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 No pain with intercourse.  Urine normal.  Bowel movements normal. Breasts normal.  Will schedule mammo now. Body mass index 27.51. Fit and eating well.  Health labs with Fam MD.   Past medical history,surgical history, family history and social history were all reviewed and documented in the EPIC chart.  Gynecologic History Patient's last menstrual period was 10/03/2020.  Obstetric History OB History  Gravida Para Term Preterm AB Living  1 1       1   SAB IAB Ectopic Multiple Live Births               # Outcome Date GA Lbr Len/2nd Weight Sex Delivery Anes PTL Lv  1 Para              ROS: A ROS was performed and pertinent positives and negatives are included in the history.  GENERAL: No fevers or chills. HEENT: No change in vision, no earache, sore throat or sinus congestion. NECK: No pain or stiffness. CARDIOVASCULAR: No chest pain or pressure. No palpitations. PULMONARY: No shortness of breath, cough or wheeze. GASTROINTESTINAL: No abdominal pain, nausea, vomiting or diarrhea, melena or bright red blood per rectum. GENITOURINARY: No urinary frequency, urgency, hesitancy or dysuria. MUSCULOSKELETAL: No joint or muscle pain, no back pain, no recent trauma. DERMATOLOGIC: No rash, no itching, no lesions. ENDOCRINE: No polyuria, polydipsia, no heat or cold intolerance. No recent change in weight. HEMATOLOGICAL: No anemia or easy bruising or bleeding. NEUROLOGIC: No headache, seizures, numbness, tingling or weakness. PSYCHIATRIC: No depression, no loss of interest in normal activity or change in sleep pattern.     Exam:   BP 114/72   Ht 5' 2.75" (1.594 m)   Wt 154 lb (69.9 kg)    LMP 10/03/2020   BMI 27.50 kg/m   Body mass index is 27.5 kg/m.  General appearance : Well developed well nourished female. No acute distress HEENT: Eyes: no retinal hemorrhage or exudates,  Neck supple, trachea midline, no carotid bruits, no thyroidmegaly Lungs: Clear to auscultation, no rhonchi or wheezes, or rib retractions  Heart: Regular rate and rhythm, no murmurs or gallops Breast:Examined in sitting and supine position were symmetrical in appearance, no palpable masses or tenderness,  no skin retraction, no nipple inversion, no nipple discharge, no skin discoloration, no axillary or supraclavicular lymphadenopathy Abdomen: no palpable masses or tenderness, no rebound or guarding Extremities: no edema or skin discoloration or tenderness  Pelvic: Vulva: Normal             Vagina: No gross lesions or discharge  Cervix: No gross lesions or discharge.  Pap reflex done.  Uterus  AV, normal size, shape and consistency, non-tender and mobile  Adnexa  Without masses or tenderness  Anus: Normal   Assessment/Plan:  45 y.o. female for annual exam   1. Encounter for routine gynecological examination with Papanicolaou smear of cervix Normal gyn exam.  Pap reflex done.  Breast exam normal.  Will schedule a screening mammo now.  BMI 27.5.  Continue with fitness and healthy nutrition.  Health labs with Fam MD. - Cytology - PAP( [facility])  2. Encounter for surveillance of contraceptive pills Well on the generic of LoEstrin  Fe 1/20.  No CI to continue.  Prescription sent to pharmacy.  Other orders - norethindrone-ethinyl estradiol-FE (LOESTRIN FE) 1-20 MG-MCG tablet; Take 1 tablet by mouth daily.  Genia Del MD, 11:26 AM 10/12/2020

## 2020-10-17 LAB — CYTOLOGY - PAP
Comment: NEGATIVE
Diagnosis: NEGATIVE
High risk HPV: NEGATIVE

## 2020-12-14 ENCOUNTER — Other Ambulatory Visit: Payer: Self-pay | Admitting: Family Medicine

## 2020-12-14 DIAGNOSIS — Z1231 Encounter for screening mammogram for malignant neoplasm of breast: Secondary | ICD-10-CM

## 2020-12-15 DIAGNOSIS — Z87442 Personal history of urinary calculi: Secondary | ICD-10-CM | POA: Diagnosis not present

## 2020-12-15 DIAGNOSIS — M545 Low back pain, unspecified: Secondary | ICD-10-CM | POA: Diagnosis not present

## 2020-12-30 DIAGNOSIS — F429 Obsessive-compulsive disorder, unspecified: Secondary | ICD-10-CM | POA: Diagnosis not present

## 2020-12-30 DIAGNOSIS — F988 Other specified behavioral and emotional disorders with onset usually occurring in childhood and adolescence: Secondary | ICD-10-CM | POA: Diagnosis not present

## 2021-02-01 ENCOUNTER — Other Ambulatory Visit: Payer: Self-pay | Admitting: Obstetrics & Gynecology

## 2021-02-01 DIAGNOSIS — Z1231 Encounter for screening mammogram for malignant neoplasm of breast: Secondary | ICD-10-CM

## 2021-02-02 ENCOUNTER — Ambulatory Visit: Payer: Self-pay

## 2021-03-08 ENCOUNTER — Ambulatory Visit: Payer: Self-pay

## 2021-04-05 ENCOUNTER — Ambulatory Visit: Payer: Self-pay

## 2021-05-04 ENCOUNTER — Ambulatory Visit: Payer: Self-pay

## 2021-06-27 ENCOUNTER — Ambulatory Visit: Payer: BC Managed Care – PPO

## 2021-07-05 DIAGNOSIS — F429 Obsessive-compulsive disorder, unspecified: Secondary | ICD-10-CM | POA: Diagnosis not present

## 2021-07-05 DIAGNOSIS — F988 Other specified behavioral and emotional disorders with onset usually occurring in childhood and adolescence: Secondary | ICD-10-CM | POA: Diagnosis not present

## 2021-08-10 DIAGNOSIS — D225 Melanocytic nevi of trunk: Secondary | ICD-10-CM | POA: Diagnosis not present

## 2021-08-10 DIAGNOSIS — D229 Melanocytic nevi, unspecified: Secondary | ICD-10-CM | POA: Diagnosis not present

## 2021-08-10 DIAGNOSIS — D485 Neoplasm of uncertain behavior of skin: Secondary | ICD-10-CM | POA: Diagnosis not present

## 2021-08-10 DIAGNOSIS — L814 Other melanin hyperpigmentation: Secondary | ICD-10-CM | POA: Diagnosis not present

## 2021-08-10 DIAGNOSIS — L821 Other seborrheic keratosis: Secondary | ICD-10-CM | POA: Diagnosis not present

## 2021-08-14 ENCOUNTER — Other Ambulatory Visit: Payer: Self-pay | Admitting: Obstetrics & Gynecology

## 2021-08-14 DIAGNOSIS — Z1231 Encounter for screening mammogram for malignant neoplasm of breast: Secondary | ICD-10-CM

## 2021-08-17 ENCOUNTER — Ambulatory Visit
Admission: RE | Admit: 2021-08-17 | Discharge: 2021-08-17 | Disposition: A | Discharge status: Home / Self Care | Payer: BC Managed Care – PPO | Source: Ambulatory Visit

## 2021-08-17 DIAGNOSIS — Z1231 Encounter for screening mammogram for malignant neoplasm of breast: Secondary | ICD-10-CM

## 2021-09-08 DIAGNOSIS — L905 Scar conditions and fibrosis of skin: Secondary | ICD-10-CM | POA: Diagnosis not present

## 2021-09-08 DIAGNOSIS — D485 Neoplasm of uncertain behavior of skin: Secondary | ICD-10-CM | POA: Diagnosis not present

## 2022-01-24 DIAGNOSIS — F988 Other specified behavioral and emotional disorders with onset usually occurring in childhood and adolescence: Secondary | ICD-10-CM | POA: Diagnosis not present

## 2022-01-24 DIAGNOSIS — F429 Obsessive-compulsive disorder, unspecified: Secondary | ICD-10-CM | POA: Diagnosis not present

## 2022-01-24 DIAGNOSIS — U071 COVID-19: Secondary | ICD-10-CM | POA: Diagnosis not present

## 2022-07-25 DIAGNOSIS — F429 Obsessive-compulsive disorder, unspecified: Secondary | ICD-10-CM | POA: Diagnosis not present

## 2022-07-25 DIAGNOSIS — Z0000 Encounter for general adult medical examination without abnormal findings: Secondary | ICD-10-CM | POA: Diagnosis not present

## 2022-07-25 DIAGNOSIS — F988 Other specified behavioral and emotional disorders with onset usually occurring in childhood and adolescence: Secondary | ICD-10-CM | POA: Diagnosis not present

## 2022-08-31 DIAGNOSIS — Z1329 Encounter for screening for other suspected endocrine disorder: Secondary | ICD-10-CM | POA: Diagnosis not present

## 2022-08-31 DIAGNOSIS — Z0000 Encounter for general adult medical examination without abnormal findings: Secondary | ICD-10-CM | POA: Diagnosis not present

## 2022-08-31 DIAGNOSIS — Z131 Encounter for screening for diabetes mellitus: Secondary | ICD-10-CM | POA: Diagnosis not present

## 2022-08-31 DIAGNOSIS — Z13220 Encounter for screening for lipoid disorders: Secondary | ICD-10-CM | POA: Diagnosis not present

## 2023-02-04 DIAGNOSIS — F988 Other specified behavioral and emotional disorders with onset usually occurring in childhood and adolescence: Secondary | ICD-10-CM | POA: Diagnosis not present

## 2023-02-13 ENCOUNTER — Other Ambulatory Visit: Payer: Self-pay | Admitting: Family Medicine

## 2023-02-13 DIAGNOSIS — Z1231 Encounter for screening mammogram for malignant neoplasm of breast: Secondary | ICD-10-CM

## 2023-03-08 DIAGNOSIS — Z1231 Encounter for screening mammogram for malignant neoplasm of breast: Secondary | ICD-10-CM

## 2023-04-09 ENCOUNTER — Ambulatory Visit
Admission: RE | Admit: 2023-04-09 | Discharge: 2023-04-09 | Disposition: A | Discharge status: Home / Self Care | Payer: BC Managed Care – PPO | Source: Ambulatory Visit | Attending: Family Medicine | Admitting: Family Medicine

## 2023-04-09 DIAGNOSIS — Z1231 Encounter for screening mammogram for malignant neoplasm of breast: Secondary | ICD-10-CM | POA: Diagnosis not present

## 2023-07-31 DIAGNOSIS — D125 Benign neoplasm of sigmoid colon: Secondary | ICD-10-CM | POA: Diagnosis not present

## 2023-07-31 DIAGNOSIS — Z1211 Encounter for screening for malignant neoplasm of colon: Secondary | ICD-10-CM | POA: Diagnosis not present

## 2023-08-15 DIAGNOSIS — Z0000 Encounter for general adult medical examination without abnormal findings: Secondary | ICD-10-CM | POA: Diagnosis not present

## 2023-08-15 DIAGNOSIS — F988 Other specified behavioral and emotional disorders with onset usually occurring in childhood and adolescence: Secondary | ICD-10-CM | POA: Diagnosis not present

## 2023-08-15 DIAGNOSIS — F429 Obsessive-compulsive disorder, unspecified: Secondary | ICD-10-CM | POA: Diagnosis not present

## 2023-09-23 DIAGNOSIS — Z1329 Encounter for screening for other suspected endocrine disorder: Secondary | ICD-10-CM | POA: Diagnosis not present

## 2023-09-23 DIAGNOSIS — Z0000 Encounter for general adult medical examination without abnormal findings: Secondary | ICD-10-CM | POA: Diagnosis not present

## 2024-01-29 DIAGNOSIS — L578 Other skin changes due to chronic exposure to nonionizing radiation: Secondary | ICD-10-CM | POA: Diagnosis not present

## 2024-01-29 DIAGNOSIS — D1801 Hemangioma of skin and subcutaneous tissue: Secondary | ICD-10-CM | POA: Diagnosis not present

## 2024-01-29 DIAGNOSIS — L309 Dermatitis, unspecified: Secondary | ICD-10-CM | POA: Diagnosis not present

## 2024-01-29 DIAGNOSIS — L821 Other seborrheic keratosis: Secondary | ICD-10-CM | POA: Diagnosis not present

## 2024-02-04 NOTE — Progress Notes (Incomplete)
 48 y.o. G1P1 female here for annual exam. Married. PCP: Regino Slater, MD   No LMP recorded.    She reports ***. Urine sample provided: ***  Abnormal bleeding: *** Pelvic discharge or pain: *** Breast mass, nipple discharge or skin changes : ***  Sexually active: *** Birth control: OCP Last PAP:     Component Value Date/Time   DIAGPAP  10/12/2020 1142    - Negative for intraepithelial lesion or malignancy (NILM)   HPVHIGH Negative 10/12/2020 1142   ADEQPAP  10/12/2020 1142    Satisfactory for evaluation; transformation zone component PRESENT.   Last mammogram: 04/09/23 density B, bi-rads 1 neg Last colonoscopy: never ***  Exercising: *** Smoker: ***       GYN HISTORY: ***  OB History  Gravida Para Term Preterm AB Living  1 1    1   SAB IAB Ectopic Multiple Live Births          # Outcome Date GA Lbr Len/2nd Weight Sex Type Anes PTL Lv  1 Para            Past Medical History:  Diagnosis Date   Anxiety    Depression    Past Surgical History:  Procedure Laterality Date   PELVIC LAPAROSCOPY Left    ovarian cystectomy    WRIST SURGERY Left    Current Outpatient Medications on File Prior to Visit  Medication Sig Dispense Refill   desvenlafaxine (PRISTIQ) 50 MG 24 hr tablet Take 50 mg by mouth daily.     lisdexamfetamine (VYVANSE) 60 MG capsule Take 60 mg by mouth every morning.     norethindrone-ethinyl estradiol-FE (LOESTRIN FE) 1-20 MG-MCG tablet Take 1 tablet by mouth daily. 84 tablet 4   No current facility-administered medications on file prior to visit.   Social History   Socioeconomic History   Marital status: Married    Spouse name: Not on file   Number of children: Not on file   Years of education: Not on file   Highest education level: Not on file  Occupational History   Not on file  Tobacco Use   Smoking status: Former    Current packs/day: 0.00    Types: Cigarettes    Quit date: 03/18/2016    Years since quitting: 7.8   Smokeless  tobacco: Never  Vaping Use   Vaping status: Never Used  Substance and Sexual Activity   Alcohol use: Not Currently   Drug use: Never   Sexual activity: Yes    Partners: Male    Comment: 1st intercourse- 69, partners- 10, married- 11 yrs   Other Topics Concern   Not on file  Social History Narrative   Not on file   Social Drivers of Health   Financial Resource Strain: Not on file  Food Insecurity: Not on file  Transportation Needs: Not on file  Physical Activity: Not on file  Stress: Not on file  Social Connections: Not on file  Intimate Partner Violence: Not on file   Family History  Problem Relation Age of Onset   Cancer Father        skin cancer   Hypertension Father    Cancer Paternal Grandmother        skin   No Known Allergies   PE There were no vitals filed for this visit. There is no height or weight on file to calculate BMI.  Physical Exam    Assessment and Plan:        There are no  diagnoses linked to this encounter. Clotilda FORBES Pa, CMA

## 2024-02-05 ENCOUNTER — Encounter: Admitting: Obstetrics and Gynecology

## 2024-03-02 DIAGNOSIS — Z124 Encounter for screening for malignant neoplasm of cervix: Secondary | ICD-10-CM | POA: Diagnosis not present

## 2024-03-02 DIAGNOSIS — N920 Excessive and frequent menstruation with regular cycle: Secondary | ICD-10-CM | POA: Diagnosis not present

## 2024-03-02 DIAGNOSIS — N951 Menopausal and female climacteric states: Secondary | ICD-10-CM | POA: Diagnosis not present

## 2024-03-02 DIAGNOSIS — Z01411 Encounter for gynecological examination (general) (routine) with abnormal findings: Secondary | ICD-10-CM | POA: Diagnosis not present

## 2024-03-03 DIAGNOSIS — F988 Other specified behavioral and emotional disorders with onset usually occurring in childhood and adolescence: Secondary | ICD-10-CM | POA: Diagnosis not present

## 2024-03-03 DIAGNOSIS — F429 Obsessive-compulsive disorder, unspecified: Secondary | ICD-10-CM | POA: Diagnosis not present

## 2024-04-01 IMAGING — MG MM DIGITAL SCREENING BILAT W/ TOMO AND CAD
8 series · 8 of 24 positions shown · non-contrast
Comparison: Previous exam(s).

CLINICAL DATA: Screening.

EXAM:
DIGITAL SCREENING BILATERAL MAMMOGRAM WITH TOMOSYNTHESIS AND CAD
TECHNIQUE: Bilateral screening digital craniocaudal and mediolateral oblique
mammograms were obtained. Bilateral screening digital breast
tomosynthesis was performed. The images were evaluated with
computer-aided detection.

[R MLO synth-2D]
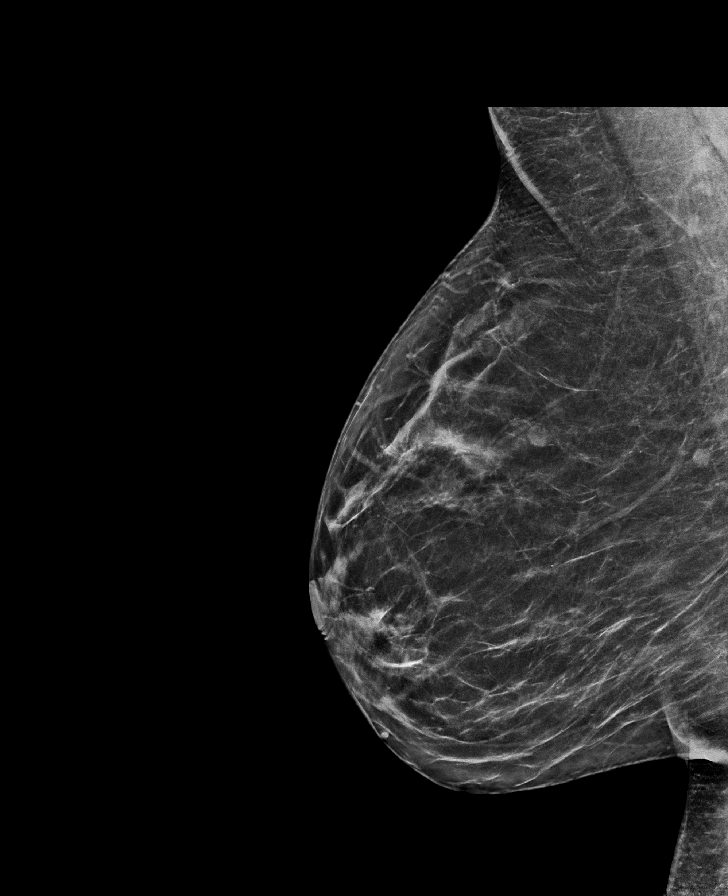

[L MLO synth-2D]
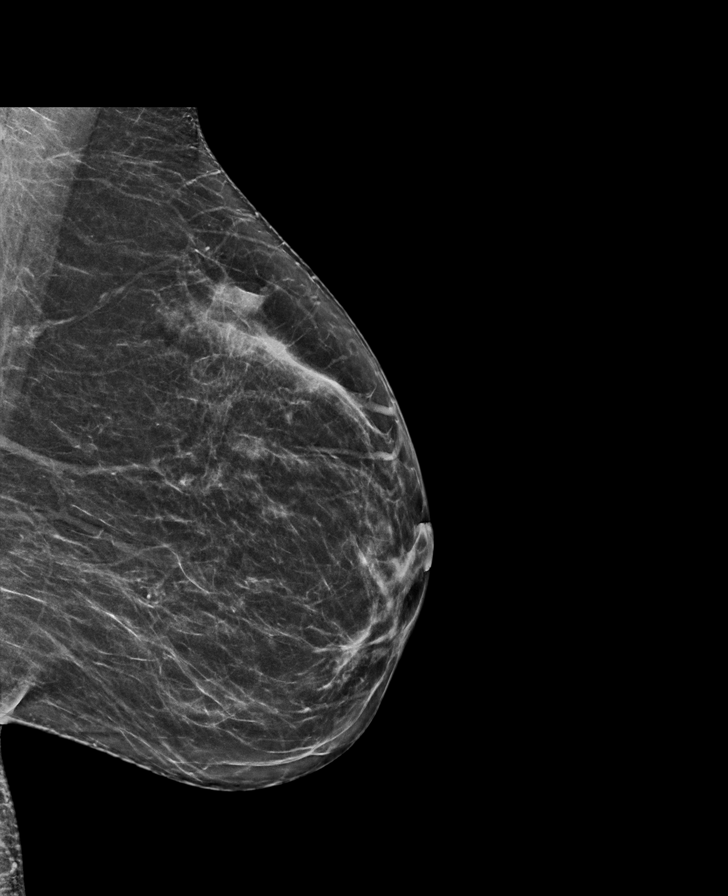

[L CC synth-2D]
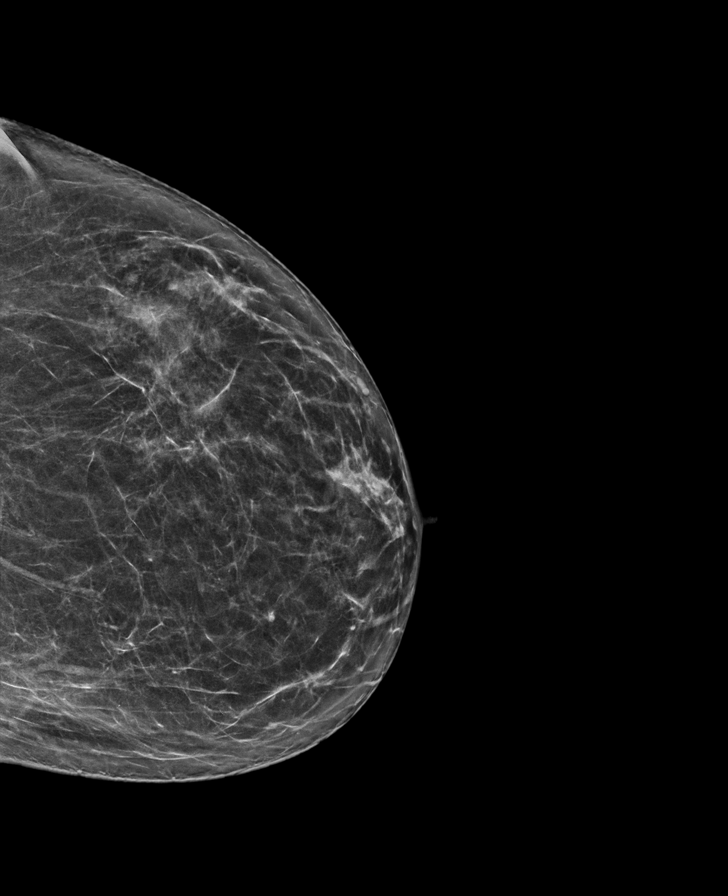

[R CC synth-2D]
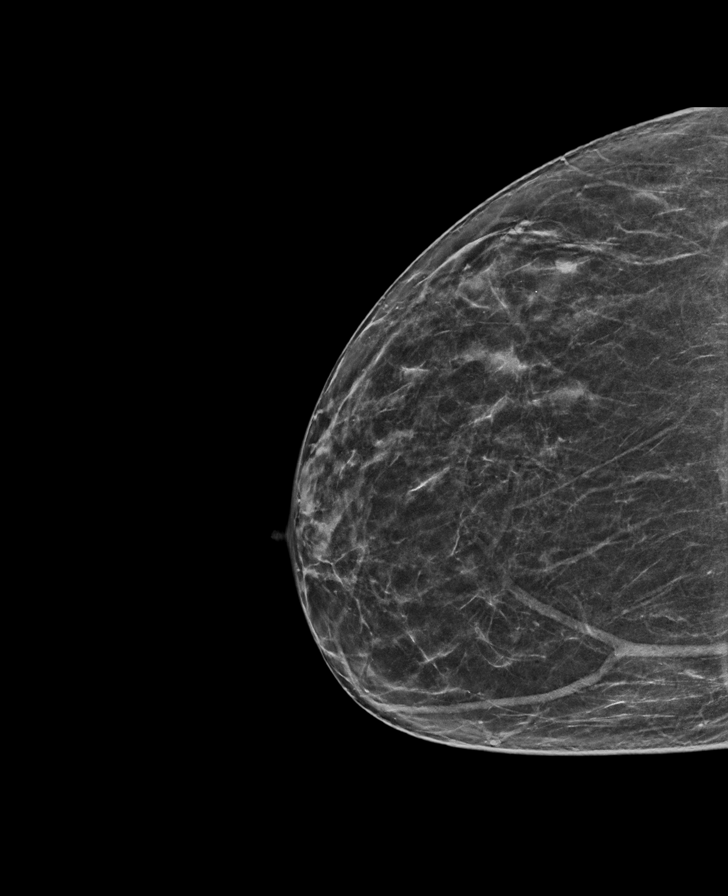

[L MLO tomo · tomo slice 35/68.0]
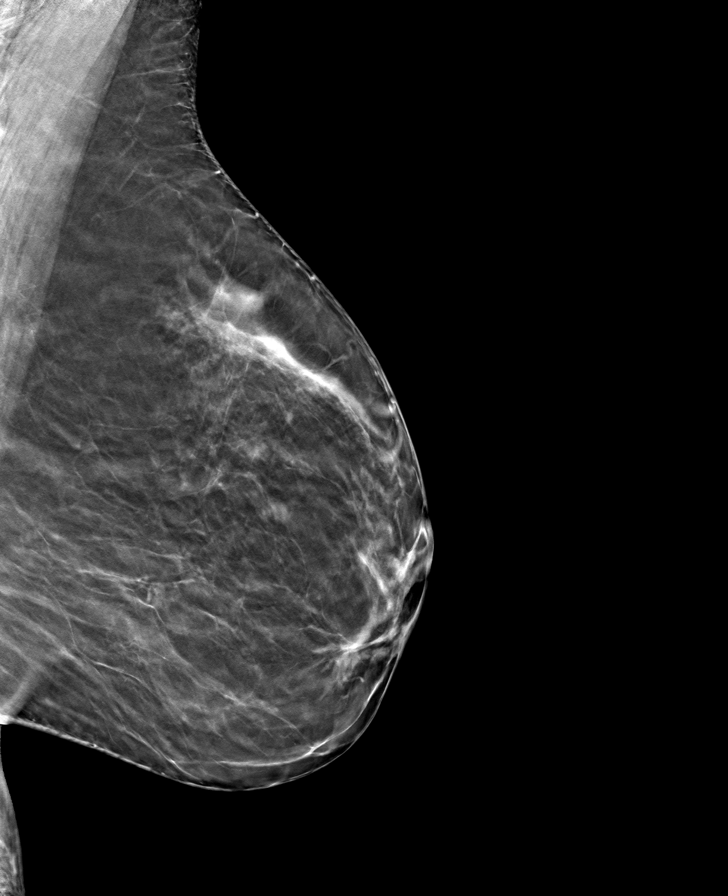

[L CC tomo · tomo slice 34/67.0]
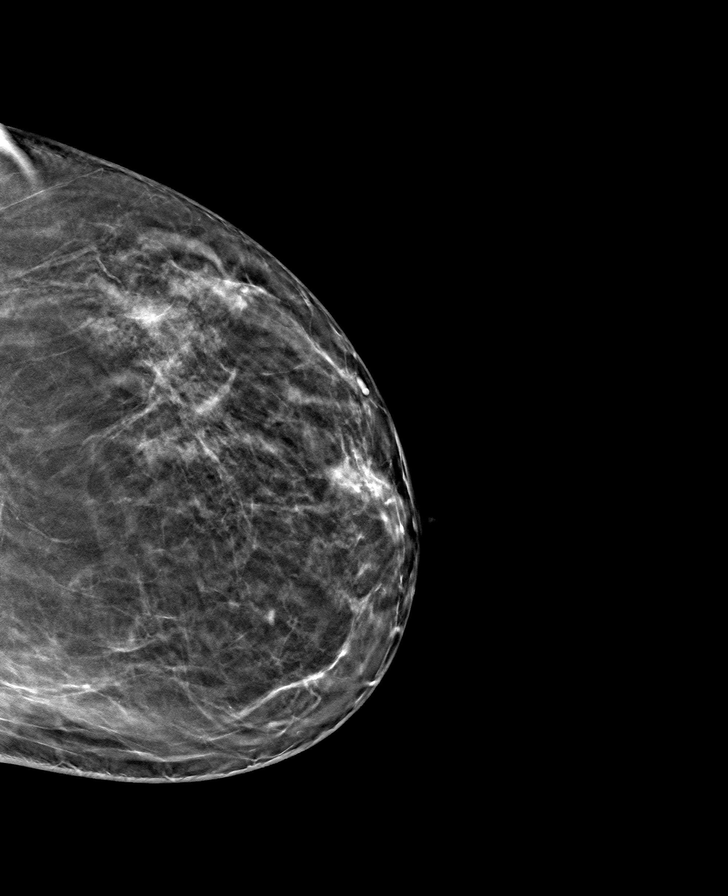

[R CC tomo · tomo slice 31/61.0]
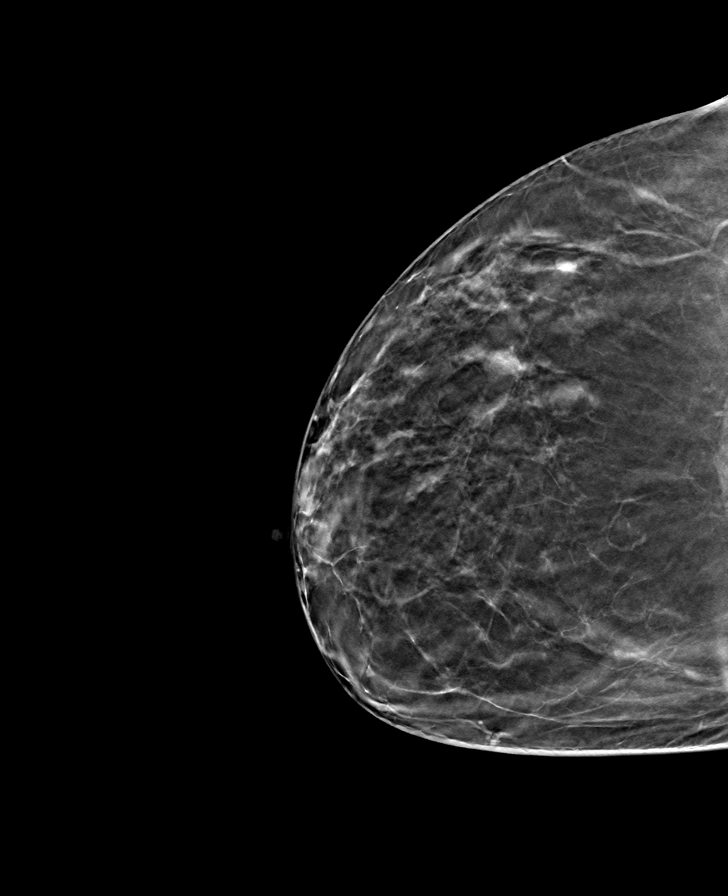

[R MLO tomo · tomo slice 37/73.0]
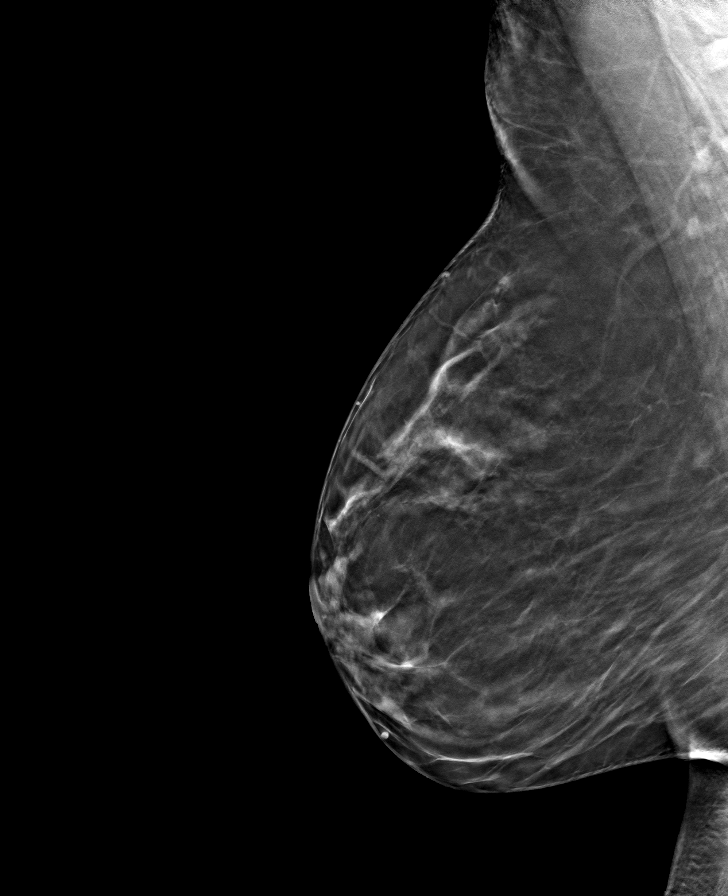

[8 of 24 positions shown; findings below may reference images not displayed]

ACR Breast Density Category b: There are scattered areas of
fibroglandular density.
FINDINGS: There are no findings suspicious for malignancy.
IMPRESSION: No mammographic evidence of malignancy. A result letter of this
screening mammogram will be mailed directly to the patient.

RECOMMENDATION:
Screening mammogram in one year. (Code:51-O-LD2)

BI-RADS CATEGORY  1: Negative.

## 2024-04-02 DIAGNOSIS — Z30430 Encounter for insertion of intrauterine contraceptive device: Secondary | ICD-10-CM | POA: Diagnosis not present
